# Patient Record
Sex: Female | Born: 1975 | Race: White | Hispanic: No | Marital: Married | Smoking: Never smoker
Health system: Southern US, Community
[De-identification: ages and names within clinical notes are randomized; demographics above are authoritative.]

---

## 2000-11-11 ENCOUNTER — Emergency Department (HOSPITAL_COMMUNITY): Admission: EM | Admit: 2000-11-11 | Discharge: 2000-11-11 | Payer: Self-pay | Admitting: Emergency Medicine

## 2000-11-18 ENCOUNTER — Emergency Department (HOSPITAL_COMMUNITY): Admission: EM | Admit: 2000-11-18 | Discharge: 2000-11-18 | Payer: Self-pay | Admitting: Emergency Medicine

## 2000-11-18 ENCOUNTER — Encounter: Payer: Self-pay | Admitting: Emergency Medicine

## 2000-11-27 ENCOUNTER — Encounter: Admission: RE | Admit: 2000-11-27 | Discharge: 2000-11-27 | Payer: Self-pay | Admitting: Orthopedic Surgery

## 2000-11-27 ENCOUNTER — Encounter: Payer: Self-pay | Admitting: Orthopedic Surgery

## 2000-12-22 ENCOUNTER — Emergency Department (HOSPITAL_COMMUNITY): Admission: EM | Admit: 2000-12-22 | Discharge: 2000-12-22 | Payer: Self-pay

## 2002-01-07 ENCOUNTER — Encounter: Payer: Self-pay | Admitting: Obstetrics & Gynecology

## 2002-01-07 ENCOUNTER — Ambulatory Visit (HOSPITAL_COMMUNITY): Admission: RE | Admit: 2002-01-07 | Discharge: 2002-01-07 | Payer: Self-pay | Admitting: Obstetrics & Gynecology

## 2002-03-16 ENCOUNTER — Encounter: Payer: Self-pay | Admitting: Obstetrics & Gynecology

## 2002-03-16 ENCOUNTER — Ambulatory Visit (HOSPITAL_COMMUNITY): Admission: RE | Admit: 2002-03-16 | Discharge: 2002-03-16 | Payer: Self-pay | Admitting: Obstetrics & Gynecology

## 2002-07-04 ENCOUNTER — Inpatient Hospital Stay (HOSPITAL_COMMUNITY): Admission: AD | Admit: 2002-07-04 | Discharge: 2002-07-04 | Payer: Self-pay | Admitting: Obstetrics & Gynecology

## 2002-07-09 ENCOUNTER — Encounter: Payer: Self-pay | Admitting: Obstetrics & Gynecology

## 2002-07-09 ENCOUNTER — Ambulatory Visit (HOSPITAL_COMMUNITY): Admission: RE | Admit: 2002-07-09 | Discharge: 2002-07-09 | Payer: Self-pay | Admitting: Obstetrics & Gynecology

## 2002-07-13 ENCOUNTER — Inpatient Hospital Stay (HOSPITAL_COMMUNITY): Admission: AD | Admit: 2002-07-13 | Discharge: 2002-07-13 | Payer: Self-pay | Admitting: Obstetrics & Gynecology

## 2002-07-28 ENCOUNTER — Inpatient Hospital Stay (HOSPITAL_COMMUNITY): Admission: AD | Admit: 2002-07-28 | Discharge: 2002-07-31 | Payer: Self-pay | Admitting: Obstetrics & Gynecology

## 2004-02-18 ENCOUNTER — Emergency Department (HOSPITAL_COMMUNITY): Admission: EM | Admit: 2004-02-18 | Discharge: 2004-02-18 | Payer: Self-pay | Admitting: Emergency Medicine

## 2004-08-09 ENCOUNTER — Emergency Department (HOSPITAL_COMMUNITY): Admission: EM | Admit: 2004-08-09 | Discharge: 2004-08-09 | Payer: Self-pay | Admitting: Emergency Medicine

## 2004-10-15 ENCOUNTER — Emergency Department (HOSPITAL_COMMUNITY): Admission: EM | Admit: 2004-10-15 | Discharge: 2004-10-15 | Payer: Self-pay | Admitting: Emergency Medicine

## 2008-11-29 ENCOUNTER — Ambulatory Visit: Payer: Self-pay | Admitting: Obstetrics & Gynecology

## 2008-12-13 ENCOUNTER — Ambulatory Visit: Payer: Self-pay | Admitting: Obstetrics & Gynecology

## 2008-12-15 ENCOUNTER — Ambulatory Visit: Payer: Self-pay | Admitting: Obstetrics & Gynecology

## 2010-04-10 ENCOUNTER — Emergency Department (INDEPENDENT_AMBULATORY_CARE_PROVIDER_SITE_OTHER)
Admission: EM | Admit: 2010-04-10 | Discharge: 2010-04-10 | Payer: Self-pay | Source: Home / Self Care | Admitting: Emergency Medicine

## 2010-04-10 DIAGNOSIS — M7989 Other specified soft tissue disorders: Secondary | ICD-10-CM

## 2010-04-10 DIAGNOSIS — M25579 Pain in unspecified ankle and joints of unspecified foot: Secondary | ICD-10-CM

## 2018-03-08 ENCOUNTER — Encounter (HOSPITAL_COMMUNITY): Payer: Self-pay

## 2018-03-08 ENCOUNTER — Other Ambulatory Visit: Payer: Self-pay

## 2018-03-08 ENCOUNTER — Emergency Department (HOSPITAL_COMMUNITY)
Admission: EM | Admit: 2018-03-08 | Discharge: 2018-03-08 | Disposition: A | Discharge status: Home / Self Care | Attending: Emergency Medicine | Admitting: Emergency Medicine

## 2018-03-08 ENCOUNTER — Emergency Department (HOSPITAL_COMMUNITY)

## 2018-03-08 DIAGNOSIS — R1031 Right lower quadrant pain: Secondary | ICD-10-CM | POA: Diagnosis present

## 2018-03-08 DIAGNOSIS — I880 Nonspecific mesenteric lymphadenitis: Secondary | ICD-10-CM

## 2018-03-08 DIAGNOSIS — R1084 Generalized abdominal pain: Secondary | ICD-10-CM

## 2018-03-08 LAB — COMPREHENSIVE METABOLIC PANEL
ALBUMIN: 3.8 g/dL (ref 3.5–5.0)
ALK PHOS: 55 U/L (ref 38–126)
ALT: 22 U/L (ref 0–44)
AST: 27 U/L (ref 15–41)
Anion gap: 8 (ref 5–15)
BILIRUBIN TOTAL: 0.4 mg/dL (ref 0.3–1.2)
BUN: 9 mg/dL (ref 6–20)
CO2: 26 mmol/L (ref 22–32)
Calcium: 8.9 mg/dL (ref 8.9–10.3)
Chloride: 108 mmol/L (ref 98–111)
Creatinine, Ser: 0.77 mg/dL (ref 0.44–1.00)
GFR calc Af Amer: >60 mL/min (ref >60)
GFR calc non Af Amer: >60 mL/min (ref >60)
GLUCOSE: 96 mg/dL (ref 70–99)
POTASSIUM: 4.1 mmol/L (ref 3.5–5.1)
SODIUM: 142 mmol/L (ref 135–145)
TOTAL PROTEIN: 7.2 g/dL (ref 6.5–8.1)

## 2018-03-08 LAB — URINALYSIS, ROUTINE W REFLEX MICROSCOPIC
Bilirubin Urine: NEGATIVE
GLUCOSE, UA: NEGATIVE mg/dL
HGB URINE DIPSTICK: NEGATIVE
Ketones, ur: NEGATIVE mg/dL
Leukocytes, UA: NEGATIVE
Nitrite: NEGATIVE
PH: 8 (ref 5.0–8.0)
Protein, ur: NEGATIVE mg/dL
Specific Gravity, Urine: 1.018 (ref 1.005–1.030)

## 2018-03-08 LAB — I-STAT CG4 LACTIC ACID, ED: LACTIC ACID, VENOUS: 2.63 mmol/L — AB (ref 0.5–1.9)

## 2018-03-08 LAB — CBC WITH DIFFERENTIAL/PLATELET
ABS IMMATURE GRANULOCYTES: 0.1 10*3/uL — AB (ref 0.00–0.07)
BASOS ABS: 0.1 10*3/uL (ref 0.0–0.1)
Basophils Relative: 1 %
Eosinophils Absolute: 0 10*3/uL (ref 0.0–0.5)
Eosinophils Relative: 0 %
HCT: 43.5 % (ref 36.0–46.0)
HEMOGLOBIN: 13.8 g/dL (ref 12.0–15.0)
IMMATURE GRANULOCYTES: 1 %
LYMPHS PCT: 17 %
Lymphs Abs: 2.4 10*3/uL (ref 0.7–4.0)
MCH: 27.3 pg (ref 26.0–34.0)
MCHC: 31.7 g/dL (ref 30.0–36.0)
MCV: 86 fL (ref 80.0–100.0)
Monocytes Absolute: 0.9 10*3/uL (ref 0.1–1.0)
Monocytes Relative: 6 %
NEUTROS ABS: 11 10*3/uL — AB (ref 1.7–7.7)
NEUTROS PCT: 75 %
NRBC: 0 % (ref 0.0–0.2)
Platelets: 381 10*3/uL (ref 150–400)
RBC: 5.06 MIL/uL (ref 3.87–5.11)
RDW: 14.4 % (ref 11.5–15.5)
WBC: 14.6 10*3/uL — ABNORMAL HIGH (ref 4.0–10.5)

## 2018-03-08 LAB — I-STAT BETA HCG BLOOD, ED (MC, WL, AP ONLY): I-stat hCG, quantitative: <5 m[IU]/mL (ref <5)

## 2018-03-08 LAB — LIPASE, BLOOD: Lipase: 32 U/L (ref 11–51)

## 2018-03-08 MED ORDER — HYDROMORPHONE HCL 1 MG/ML IJ SOLN
0.5000 mg | Freq: Once | INTRAMUSCULAR | Status: AC
  Administered 2018-03-08: 0.5 mg via INTRAVENOUS
  Filled 2018-03-08: qty 1

## 2018-03-08 MED ORDER — SODIUM CHLORIDE 0.9 % IV BOLUS
500.0000 mL | Freq: Once | INTRAVENOUS | Status: AC
  Administered 2018-03-08: 500 mL via INTRAVENOUS

## 2018-03-08 MED ORDER — SODIUM CHLORIDE 0.9 % IV BOLUS
1000.0000 mL | Freq: Once | INTRAVENOUS | Status: AC
  Administered 2018-03-08: 1000 mL via INTRAVENOUS

## 2018-03-08 MED ORDER — HYDROCODONE-ACETAMINOPHEN 5-325 MG PO TABS: ORAL_TABLET | ORAL | 0 refills | Status: AC

## 2018-03-08 MED ORDER — IOHEXOL 300 MG/ML  SOLN
100.0000 mL | Freq: Once | INTRAMUSCULAR | Status: AC | PRN
  Administered 2018-03-08: 100 mL via INTRAVENOUS

## 2018-03-08 MED ORDER — ONDANSETRON HCL 4 MG/2ML IJ SOLN
4.0000 mg | Freq: Once | INTRAMUSCULAR | Status: AC | PRN
  Administered 2018-03-08: 4 mg via INTRAVENOUS
  Filled 2018-03-08: qty 2

## 2018-03-08 NOTE — Discharge Instructions (Signed)
Please read and follow all provided instructions.  Your diagnoses today include:  1. Mesenteric adenitis   2. Generalized abdominal pain     Tests performed today include:  Blood counts and electrolytes  Blood tests to check liver and kidney function  Blood tests to check pancreas function  Urine test to look for infection  CT of your abdomen -does not show any acute infections or other findings, does show some enlarged lymph nodes in the right lower quadrant suspicious for mesenteric adenitis  Vital signs. See below for your results today.   Medications prescribed:   Vicodin (hydrocodone/acetaminophen) - narcotic pain medication  DO NOT drive or perform any activities that require you to be awake and alert because this medicine can make you drowsy. BE VERY CAREFUL not to take multiple medicines containing Tylenol (also called acetaminophen). Doing so can lead to an overdose which can damage your liver and cause liver failure and possibly death.  Take any prescribed medications only as directed.  Home care instructions:   Follow any educational materials contained in this packet.  Follow-up instructions: Please follow-up with your primary care provider in the next 2 days for recheck of your symptoms to ensure they are getting better.  Return instructions:  SEEK IMMEDIATE MEDICAL ATTENTION IF:  The pain does not go away or becomes severe   A temperature above 101F develops   Repeated vomiting occurs (multiple episodes)   The pain becomes localized to portions of the abdomen. The right side could possibly be appendicitis. In an adult, the left lower portion of the abdomen could be colitis or diverticulitis.   Blood is being passed in stools or vomit (bright red or black tarry stools)   You develop chest pain, difficulty breathing, dizziness or fainting, or become confused, poorly responsive, or inconsolable (young children)  If you have any other emergent concerns  regarding your health  Additional Information: Abdominal (belly) pain can be caused by many things. Your caregiver performed an examination and possibly ordered blood/urine tests and imaging (CT scan, x-rays, ultrasound). Many cases can be observed and treated at home after initial evaluation in the emergency department. Even though you are being discharged home, abdominal pain can be unpredictable. Therefore, you need a repeated exam if your pain does not resolve, returns, or worsens. Most patients with abdominal pain don't have to be admitted to the hospital or have surgery, but serious problems like appendicitis and gallbladder attacks can start out as nonspecific pain. Many abdominal conditions cannot be diagnosed in one visit, so follow-up evaluations are very important.  Your vital signs today were: BP 100/61 (BP Location: Left Arm)    Pulse 80    Temp 98.5 F (36.9 C) (Oral)    Resp 18    Ht 5\' 2"  (1.575 m)    Wt 87.1 kg    LMP 01/06/2018 (Approximate) Comment: had esure in 2010   SpO2 98%    BMI 35.12 kg/m  If your blood pressure (bp) was elevated above 135/85 this visit, please have this repeated by your doctor within one month. --------------

## 2018-03-08 NOTE — ED Notes (Signed)
Patient verbalizes understanding of discharge instructions. Opportunity for questioning and answers were provided. 

## 2018-03-08 NOTE — ED Provider Notes (Signed)
[facility] EMERGENCY DEPARTMENT Provider Note   CSN: [id number] Arrival date & time: 03/08/18  1417     History   Chief Complaint Chief Complaint  Patient presents with  . Abdominal Pain    HPI Susan Wiggins is a 42 y.o. female.  Patient with no past surgical history presents the emergency department with chief complaint of abdominal pain and vomiting.  Symptoms started about 3 AM today.  Symptoms awoke her from sleep.  Patient complains of generalized abdominal pain that is worse on the right side.  She denies any fevers, chest pain, shortness of breath.  She denies dysuria, hematuria, increased frequency urgency.  She denies vaginal bleeding or discharge.  She went to an outside urgent care and was given IM Phenergan and referred to the emergency department for further evaluation.  Patient does not have a history of peptic ulcer disease or heavy NSAID use.  Denies alcohol use.  Last oral intake was last evening.  She took Zofran at home without improvement of symptoms.  Pain is worse with palpation and movement.     History reviewed. No pertinent past medical history.  There are no active problems to display for this patient.   History reviewed. No pertinent surgical history.   OB History   No obstetric history on file.      Home Medications    Prior to Admission medications   Not on File    Family History History reviewed. No pertinent family history.  Social History Social History   Tobacco Use  . Smoking status: Never Smoker  Substance Use Topics  . Alcohol use: Never    Frequency: Never  . Drug use: Never     Allergies   Iodinated diagnostic agents   Review of Systems Review of Systems  Constitutional: Negative for fever.  HENT: Negative for rhinorrhea and sore throat.   Eyes: Negative for redness.  Respiratory: Negative for cough.   Cardiovascular: Negative for chest pain.  Gastrointestinal: Positive for abdominal pain,  nausea and vomiting. Negative for blood in stool and diarrhea.  Genitourinary: Negative for dysuria, hematuria, vaginal bleeding and vaginal discharge.  Musculoskeletal: Negative for myalgias.  Skin: Negative for rash.  Neurological: Negative for headaches.     Physical Exam Updated Vital Signs BP 117/67 (BP Location: Right Arm)   Pulse 78   Temp 98.5 F (36.9 C) (Oral)   Resp 20   Ht 5\' 2"  (1.575 m)   Wt 87.1 kg   LMP 01/06/2018 (Approximate) Comment: had esure in 2010  SpO2 99%   BMI 35.12 kg/m   Physical Exam Vitals signs and nursing note reviewed.  Constitutional:      Appearance: She is well-developed.  HENT:     Head: Normocephalic and atraumatic.  Eyes:     General:        Right eye: No discharge.        Left eye: No discharge.     Conjunctiva/sclera: Conjunctivae normal.  Neck:     Musculoskeletal: Normal range of motion and neck supple.  Cardiovascular:     Rate and Rhythm: Normal rate and regular rhythm.     Heart sounds: Normal heart sounds.  Pulmonary:     Effort: Pulmonary effort is normal.     Breath sounds: Normal breath sounds.  Abdominal:     Palpations: Abdomen is soft.     Tenderness: There is generalized abdominal tenderness. There is no guarding or rebound. Negative signs include Murphy's sign,  Rovsing's sign and McBurney's sign.  Skin:    General: Skin is warm and dry.  Neurological:     Mental Status: She is alert.      ED Treatments / Results  Labs (all labs ordered are listed, but only abnormal results are displayed) Labs Reviewed  CBC WITH DIFFERENTIAL/PLATELET - Abnormal; Notable for the following components:      Result Value   WBC 14.6 (*)    Neutro Abs 11.0 (*)    Abs Immature Granulocytes 0.10 (*)    All other components within normal limits  LIPASE, BLOOD  COMPREHENSIVE METABOLIC PANEL  URINALYSIS, ROUTINE W REFLEX MICROSCOPIC  I-STAT BETA HCG BLOOD, ED ([facility], [facility], [facility] ONLY)  I-STAT CG4 LACTIC ACID, ED     EKG None  Radiology Ct Abdomen Pelvis W Contrast  Result Date: 03/08/2018 CLINICAL DATA:  Nausea, vomiting and lower quadrant pain. EXAM: CT ABDOMEN AND PELVIS WITH CONTRAST TECHNIQUE: Multidetector CT imaging of the abdomen and pelvis was performed using the standard protocol following bolus administration of intravenous contrast. CONTRAST:  100mL OMNIPAQUE IOHEXOL 300 MG/ML  SOLN COMPARISON:  None. FINDINGS: LOWER CHEST: Lung bases are clear. Included heart size is normal. No pericardial effusion. HEPATOBILIARY: Liver and gallbladder are normal. PANCREAS: Normal. SPLEEN: Normal. ADRENALS/URINARY TRACT: Kidneys are orthotopic, demonstrating symmetric enhancement. Delayed imaging demonstrates symmetric nonobstructed pyelograms. No nephrolithiasis, hydronephrosis or solid renal masses. Urinary bladder is decompressed and unremarkable. Normal adrenal glands. STOMACH/BOWEL: The stomach, small and large bowel are normal in course and caliber without inflammatory changes. The appendix is not confidently identified but no pericecal inflammation is seen. VASCULAR/LYMPHATIC: Aortoiliac vessels are normal in course and caliber. No lymphadenopathy by CT size criteria. A few small mesenteric lymph nodes are seen in the right lower quadrant without pathologic enlargement. Mild mesenteric adenitis is a possibility. REPRODUCTIVE: Bilateral Essure devices are noted. Low-density fluid within the endometrial cavity. Correlate with phase of menstrual cycle. No adnexal mass. Dominant follicle the right ovary measuring up to 3 cm. OTHER: No intraperitoneal free fluid or free air. MUSCULOSKELETAL: Nonacute. IMPRESSION: 1. A few small mesenteric lymph nodes are seen in the right lower quadrant without pathologic enlargement. Mild mesenteric adenitis is a possibility. Normal appearing appendix. 2. Low-density fluid within the endometrial cavity measuring up to 11 mm in thickness. Correlate with phase of menstrual cycle.  Electronically Signed   By: Tollie Ethavid  Kwon M.D.   On: 03/08/2018 17:19    Procedures Procedures (including critical care time)  Medications Ordered in ED Medications  HYDROmorphone (DILAUDID) injection 0.5 mg (has no administration in time range)  ondansetron (ZOFRAN) injection 4 mg (4 mg Intravenous Given 03/08/18 1527)  HYDROmorphone (DILAUDID) injection 0.5 mg (0.5 mg Intravenous Given 03/08/18 1527)  sodium chloride 0.9 % bolus 500 mL (500 mLs Intravenous New Bag/Given 03/08/18 1527)  iohexol (OMNIPAQUE) 300 MG/ML solution 100 mL (100 mLs Intravenous Contrast Given 03/08/18 1640)     Initial Impression / Assessment and Plan / ED Course  I have reviewed the triage vital signs and the nursing notes.  Pertinent labs & imaging results that were available during my care of the patient were reviewed by me and considered in my medical decision making (see chart for details).     Patient seen and examined. Work-up initiated. Medications ordered. She does not have very focal pain but is generally moderately tender.  CT imaging ordered.  Vital signs reviewed and are as follows: BP 117/67 (BP Location: Right Arm)   Pulse 78  Temp 98.5 F (36.9 C) (Oral)   Resp 20   Ht 5\' 2"  (1.575 m)   Wt 87.1 kg   LMP 01/06/2018 (Approximate) Comment: had esure in 2010  SpO2 99%   BMI 35.12 kg/m   Patient has an allergy noted to IV dye.  Patient states that she only has an allergy to the brown coating on tablets of ibuprofen.  She states that she has had IV contrast in the past without any results.  She has no indication for premedication given her history.  4:03 PM Elevated WBC noted. Last BP 92/53. Lactic acid ordered. Continue fluids.   5:32 PM CT reviewed.  Patient updated on results.  Pain currently 3 out of 10.  Will give additional dose of pain medicine.  Anticipate discharge to home if she continues to do well.  We discussed working diagnosis of mesenteric adenitis.  We discussed that if  her symptoms worsen or persist, she will need to be followed up by her primary care doctor ordered by returning to the emergency department in the next 24 to 48 hours.  We discussed that there are no signs of a more severe condition on her CT scan today.   The patient was urged to return to the Emergency Department immediately with worsening of current symptoms, worsening abdominal pain, persistent vomiting, blood noted in stools, fever, or any other concerns. The patient verbalized understanding.   8:00 PM lactate came back late.  Patient agreeable to stay for an additional liter of fluids.  This was given.  We will discharged home with small amount of pain medication.  Encourage PCP follow-up for recheck in the next 48 hours, return with worsening as above.  Patient counseled on use of narcotic pain medications. Counseled not to combine these medications with others containing tylenol. Urged not to drink alcohol, drive, or perform any other activities that requires focus while taking these medications. The patient verbalizes understanding and agrees with the plan.  Final Clinical Impressions(s) / ED Diagnoses   Final diagnoses:  Mesenteric adenitis  Generalized abdominal pain   Patient with abdominal pain starting today. Vitals are stable, no fever. Labs normal other than mild elevated lactate and elevated Stehr blood cell count.  Imaging most consistent with mesenteric adenitis, no definite appendicitis or other intra-abdominal etiology. No signs of dehydration, patient is tolerating PO's. Lungs are clear and no signs suggestive of PNA. Low concern for appendicitis at this time, cholecystitis, pancreatitis, ruptured viscus, UTI, kidney stone, aortic dissection, aortic aneurysm or other emergent abdominal etiology. Supportive therapy indicated with return if symptoms worsen.    ED Discharge Orders         Ordered    HYDROcodone-acetaminophen (NORCO/VICODIN) 5-325 MG tablet     03/08/18 1958            Renne CriglerGeiple, Nygeria Lager, Cordelia Poche-C 03/08/18 Sudie Grumbling2001    Rees, Elizabeth, MD 03/09/18 [phone]

## 2018-03-08 NOTE — ED Notes (Signed)
I Stat Lac Acid result of 2.63 reported to Wal-MartJosh Geiple PA

## 2019-07-07 IMAGING — CT CT ABD-PELV W/ CM
2 of 5 series · 16 of 46 positions shown, 18 images · IV contrast (omnipaque)
Comparison: None.

CLINICAL DATA: Nausea, vomiting and lower quadrant pain.

EXAM:
CT ABDOMEN AND PELVIS WITH CONTRAST
TECHNIQUE: Multidetector CT imaging of the abdomen and pelvis was performed
using the standard protocol following bolus administration of
intravenous contrast.
CONTRAST:  100mL OMNIPAQUE IOHEXOL 300 MG/ML  SOLN

[Series 3: a/p w/ 5mm · axial · 0.74mm/px · z∈[-817,-422]mm · 13 of 89 slices shown, 15 images]
[im 5/89  soft-tissue]
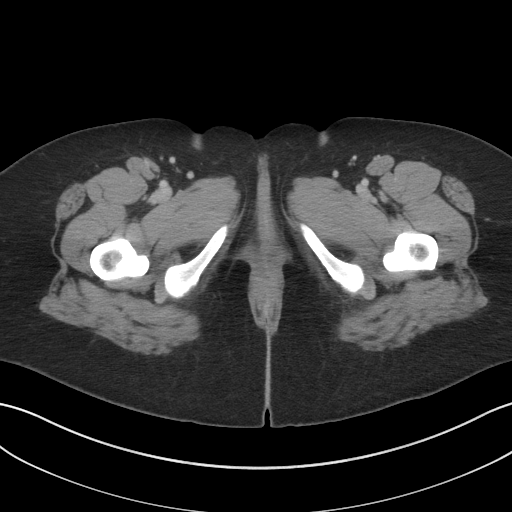
[im 5/89  bone]
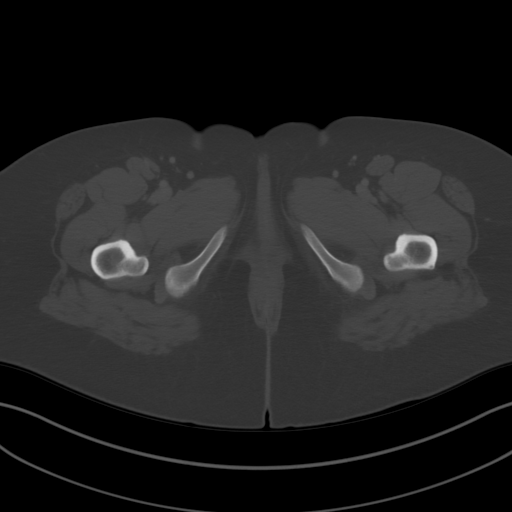
[im 14/89  soft-tissue]
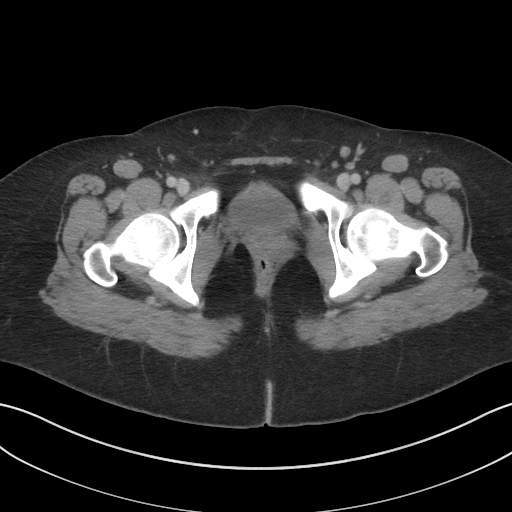
[im 18/89  soft-tissue]
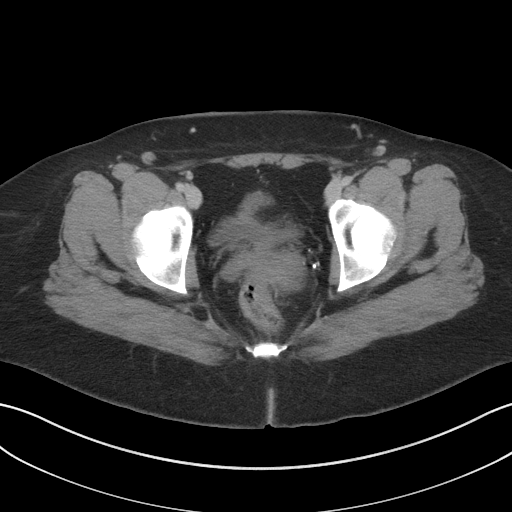
[im 27/89  soft-tissue]
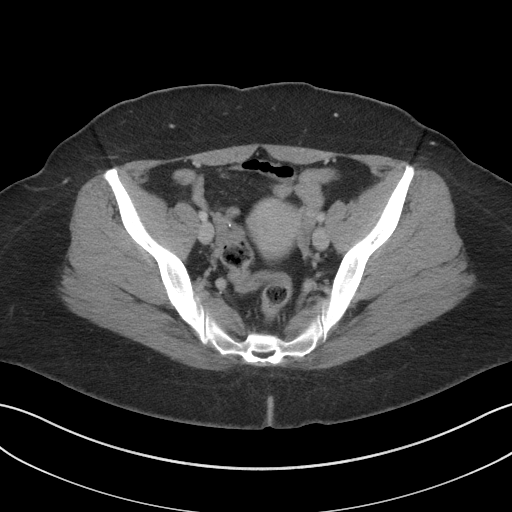
[im 31/89  soft-tissue]
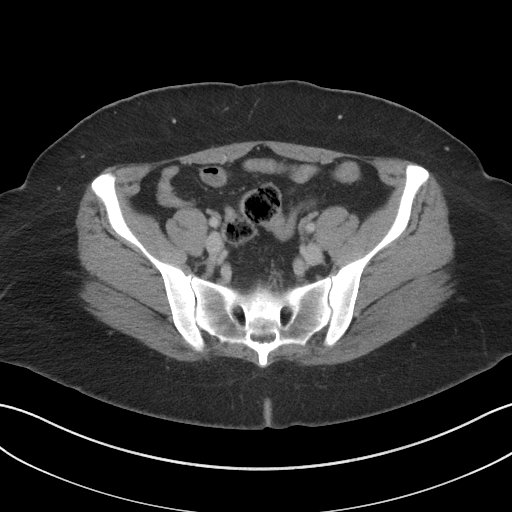
[im 40/89  soft-tissue]
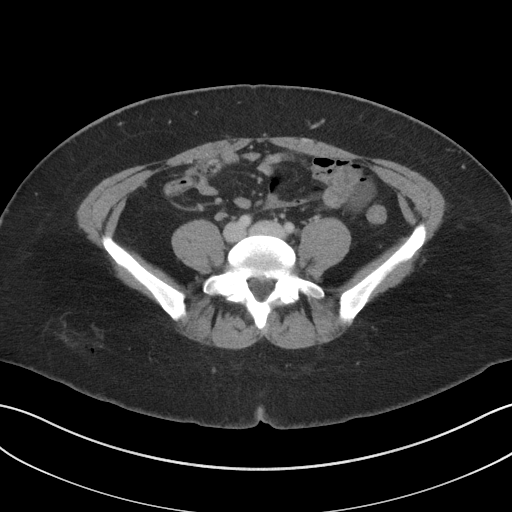
[im 45/89  soft-tissue]
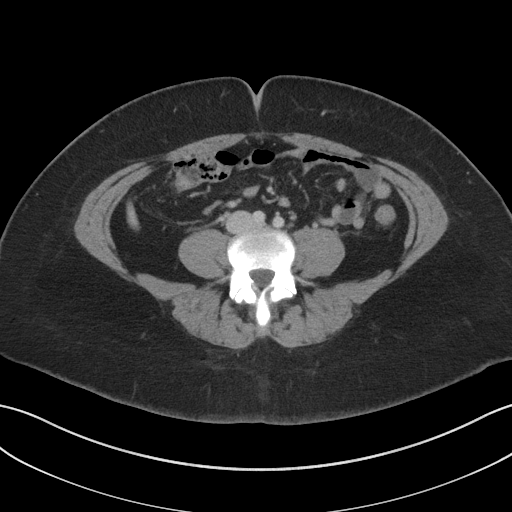
[im 49/89  soft-tissue]
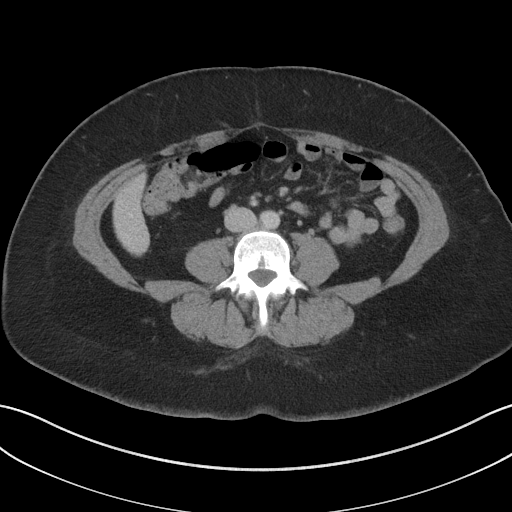
[im 58/89  soft-tissue]
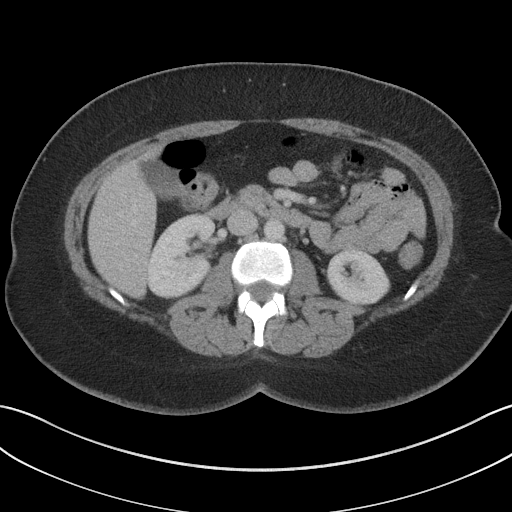
[im 58/89  bone]
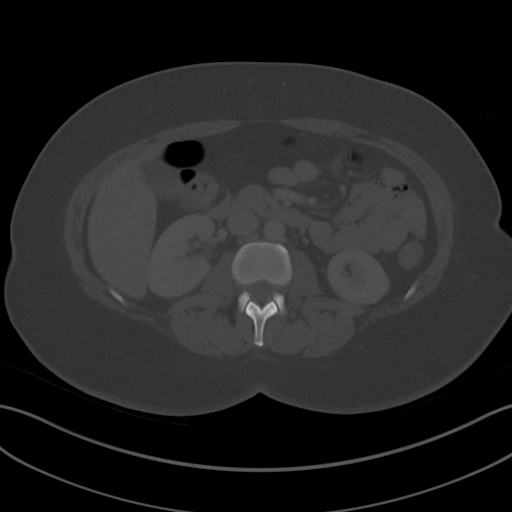
[im 62/89  soft-tissue]
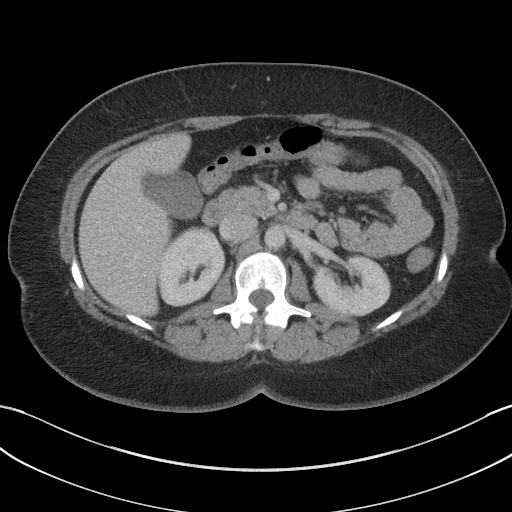
[im 71/89  soft-tissue]
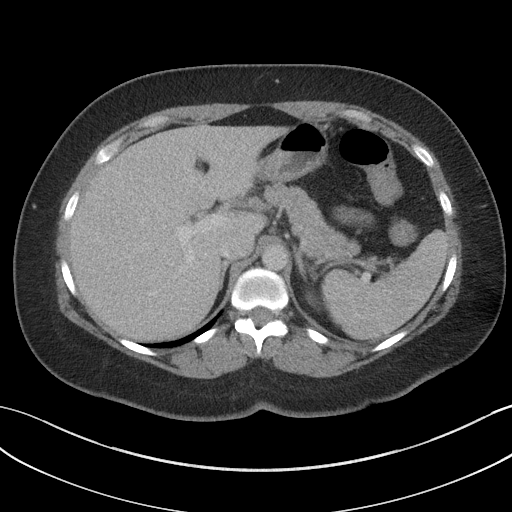
[im 75/89  soft-tissue]
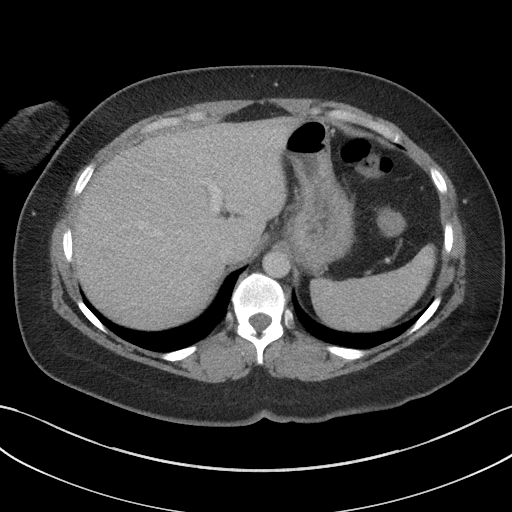
[im 84/89  soft-tissue]
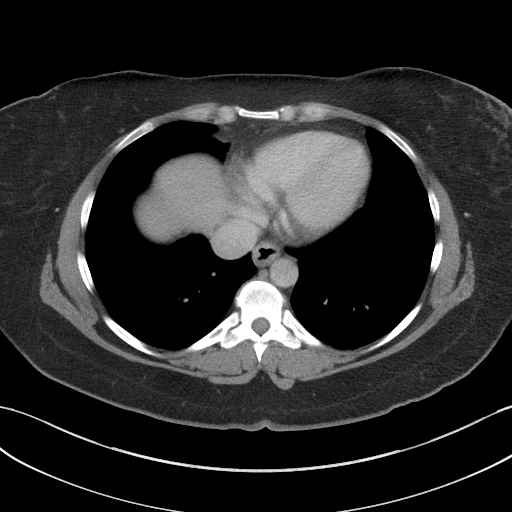

[Series 6: a/p w/ cor · coronal · 0.70mm/px · 3 of 149 slices shown]
[im 50/149  soft-tissue]
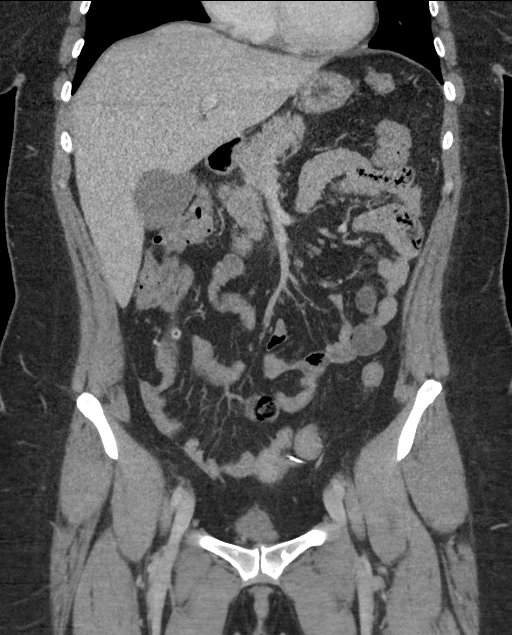
[im 66/149  soft-tissue]
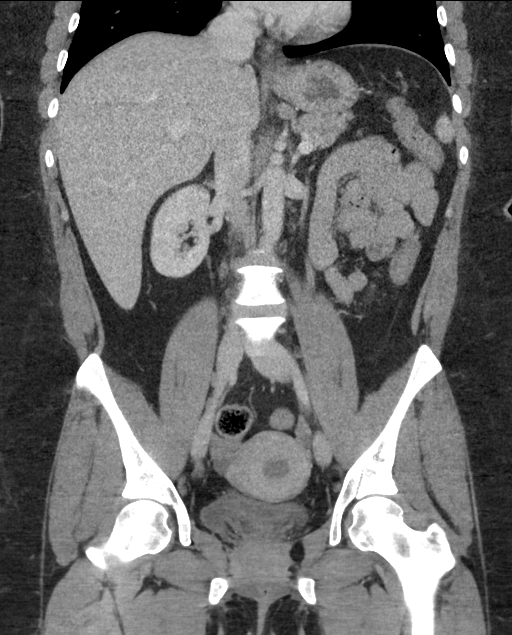
[im 83/149  soft-tissue]
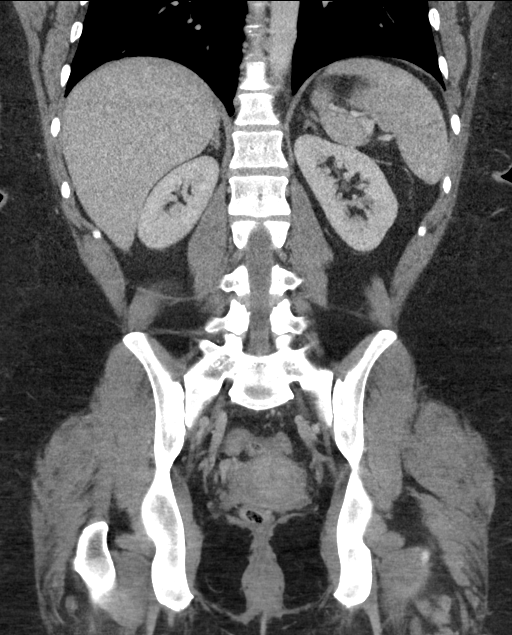

[16 of 46 positions shown; findings below may reference images not displayed]

FINDINGS: LOWER CHEST: Lung bases are clear. Included heart size is normal. No
pericardial effusion.

HEPATOBILIARY: Liver and gallbladder are normal.

PANCREAS: Normal.

SPLEEN: Normal.

ADRENALS/URINARY TRACT: Kidneys are orthotopic, demonstrating
symmetric enhancement. Delayed imaging demonstrates symmetric
nonobstructed pyelograms. No nephrolithiasis, hydronephrosis or
solid renal masses. Urinary bladder is decompressed and
unremarkable. Normal adrenal glands.

STOMACH/BOWEL: The stomach, small and large bowel are normal in
course and caliber without inflammatory changes. The appendix is not
confidently identified but no pericecal inflammation is seen.

VASCULAR/LYMPHATIC: Aortoiliac vessels are normal in course and
caliber. No lymphadenopathy by CT size criteria. A few small
mesenteric lymph nodes are seen in the right lower quadrant without
pathologic enlargement. Mild mesenteric adenitis is a possibility.

REPRODUCTIVE: Bilateral Essure devices are noted. Low-density fluid
within the endometrial cavity. Correlate with phase of menstrual
cycle. No adnexal mass. Dominant follicle the right ovary measuring
up to 3 cm.

OTHER: No intraperitoneal free fluid or free air.

MUSCULOSKELETAL: Nonacute.
IMPRESSION: 1. A few small mesenteric lymph nodes are seen in the right lower
quadrant without pathologic enlargement. Mild mesenteric adenitis is
a possibility. Normal appearing appendix.
2. Low-density fluid within the endometrial cavity measuring up to
11 mm in thickness. Correlate with phase of menstrual cycle.

## 2021-06-14 ENCOUNTER — Other Ambulatory Visit: Payer: Self-pay | Admitting: Nurse Practitioner

## 2021-06-14 DIAGNOSIS — Z1231 Encounter for screening mammogram for malignant neoplasm of breast: Secondary | ICD-10-CM

## 2021-11-09 ENCOUNTER — Encounter: Payer: Self-pay | Admitting: Neurology

## 2022-04-08 NOTE — Progress Notes (Deleted)
   NEUROLOGY CONSULTATION NOTE  Susan Wiggins MRN: [id number] DOB: [birth year]  Referring provider: Lupe Carney, NP Primary care provider: ***  Reason for consult:  headache  Assessment/Plan:   ***   Subjective:  Susan Wiggins is a 47 year old female *** who presents for headaches.  History supplemented by referring provider's note.  Onset:  *** Location:  *** Quality:  *** Intensity:  ***.  *** denies new headache, thunderclap headache or severe headache that wakes *** from sleep. Aura:  *** Prodrome:  *** Associated symptoms:  ***.  She denies associated unilateral numbness or weakness. Duration:  *** Frequency:  *** Frequency of abortive medication: *** Triggers:  *** Relieving factors:  *** Activity:  ***  Past NSAIDS/analgesics:  *** Past abortive triptans:  *** Past abortive ergotamine:  *** Past muscle relaxants:  *** Past anti-emetic:  *** Past antihypertensive medications:  *** Past antidepressant medications:  *** Past anticonvulsant medications:  *** Past anti-CGRP:  *** Past vitamins/Herbal/Supplements:  *** Past antihistamines/decongestants:  *** Other past therapies:  ***  Current NSAIDS/analgesics:  *** Current triptans:  *** Current ergotamine:  *** Current anti-emetic:  *** Current muscle relaxants:  *** Current Antihypertensive medications:  *** Current Antidepressant medications:  *** Current Anticonvulsant medications:  *** Current anti-CGRP:  *** Current Vitamins/Herbal/Supplements:  *** Current Antihistamines/Decongestants:  *** Other therapy:  *** Birth control:  *** Other medications:  ***   Caffeine:  *** Alcohol:  *** Smoker:  *** Diet:  *** Exercise:  *** Depression:  ***; Anxiety:  *** Other pain:  *** Sleep hygiene:  *** Family history of headache:  ***      PAST MEDICAL HISTORY: ***  PAST SURGICAL HISTORY: ***  MEDICATIONS: ***   ALLERGIES: Allergies  Allergen Reactions   Iodinated Contrast Media  Swelling    Brownish orange color dye on advil Pt states that she has had IV contrast in the past with no adverse reaction    FAMILY HISTORY: No family history on file.  Objective:  *** General: No acute distress.  Patient appears well-groomed.   Head:  Normocephalic/atraumatic Eyes:  fundi examined but not visualized Neck: supple, no paraspinal tenderness, full range of motion Back: No paraspinal tenderness Heart: regular rate and rhythm Lungs: Clear to auscultation bilaterally. Vascular: No carotid bruits. Neurological Exam: Mental status: alert and oriented to person, place, and time, speech fluent and not dysarthric, language intact. Cranial nerves: CN I: not tested CN II: pupils equal, round and reactive to light, visual fields intact CN III, IV, VI:  full range of motion, no nystagmus, no ptosis CN V: facial sensation intact. CN VII: upper and lower face symmetric CN VIII: hearing intact CN IX, X: gag intact, uvula midline CN XI: sternocleidomastoid and trapezius muscles intact CN XII: tongue midline Bulk & Tone: normal, no fasciculations. Motor:  muscle strength 5/5 throughout Sensation:  Pinprick, temperature and vibratory sensation intact. Deep Tendon Reflexes:  2+ throughout,  toes downgoing.   Finger to nose testing:  Without dysmetria.   Heel to shin:  Without dysmetria.   Gait:  Normal station and stride.  Romberg negative.    Thank you for allowing me to take part in the care of this patient.  Metta Clines, DO  CC: ***

## 2022-04-09 ENCOUNTER — Ambulatory Visit: Admitting: Neurology

## 2022-04-09 ENCOUNTER — Encounter: Payer: Self-pay | Admitting: Neurology

## 2022-04-09 DIAGNOSIS — Z029 Encounter for administrative examinations, unspecified: Secondary | ICD-10-CM

## 2024-04-16 ENCOUNTER — Other Ambulatory Visit: Payer: Self-pay | Admitting: Family

## 2024-04-16 DIAGNOSIS — Z1231 Encounter for screening mammogram for malignant neoplasm of breast: Secondary | ICD-10-CM

## 2024-04-23 ENCOUNTER — Encounter
